# Patient Record
Sex: Male | Born: 1975 | Race: White | Hispanic: Yes | Marital: Married | Smoking: Never smoker
Health system: Southern US, Community
[De-identification: ages and names within clinical notes are randomized; demographics above are authoritative.]

## PROBLEM LIST (undated history)

## (undated) DIAGNOSIS — I10 Essential (primary) hypertension: Secondary | ICD-10-CM

---

## 2000-10-18 ENCOUNTER — Emergency Department (HOSPITAL_COMMUNITY): Admission: EM | Admit: 2000-10-18 | Discharge: 2000-10-18 | Payer: Self-pay | Admitting: Internal Medicine

## 2001-05-21 ENCOUNTER — Ambulatory Visit (HOSPITAL_COMMUNITY): Admission: RE | Admit: 2001-05-21 | Discharge: 2001-05-21 | Payer: Self-pay | Admitting: Pulmonary Disease

## 2001-11-06 ENCOUNTER — Ambulatory Visit (HOSPITAL_COMMUNITY): Admission: RE | Admit: 2001-11-06 | Discharge: 2001-11-06 | Payer: Self-pay | Admitting: Pulmonary Disease

## 2005-05-17 ENCOUNTER — Ambulatory Visit (HOSPITAL_COMMUNITY): Admission: RE | Admit: 2005-05-17 | Discharge: 2005-05-17 | Payer: Self-pay | Admitting: Pulmonary Disease

## 2010-02-21 ENCOUNTER — Encounter: Payer: Self-pay | Admitting: Pulmonary Disease

## 2012-05-07 ENCOUNTER — Emergency Department (HOSPITAL_COMMUNITY)
Admission: EM | Admit: 2012-05-07 | Discharge: 2012-05-07 | Disposition: A | Discharge status: Home / Self Care | Payer: Self-pay | Attending: Emergency Medicine | Admitting: Emergency Medicine

## 2012-05-07 ENCOUNTER — Encounter (HOSPITAL_COMMUNITY): Payer: Self-pay

## 2012-05-07 DIAGNOSIS — I10 Essential (primary) hypertension: Secondary | ICD-10-CM | POA: Insufficient documentation

## 2012-05-07 NOTE — ED Provider Notes (Signed)
History  This chart was scribed for Kyle Gaskins, MD by Bennett Scrape, ED Scribe. This patient was seen in room APA11/APA11 and the patient's care was started at 10:17 PM.  CSN: [id number]  Arrival date & time 05/07/12  2138   First MD Initiated Contact with Patient 05/07/12 2217      Chief Complaint  Patient presents with  . Hypertension    Patient is a 37 y.o. male presenting with hypertension. The history is provided by the patient. No language interpreter was used.  Hypertension This is a new problem. The problem occurs constantly. The problem has been gradually improving. Pertinent negatives include no chest pain, no headaches and no shortness of breath. Nothing aggravates the symptoms. Nothing relieves the symptoms. He has tried nothing for the symptoms.    Kyle Garner is a 37 y.o. male who presents to the Emergency Department complaining of HTN noted during a pre-employment physical today. BP is 143/89 upon last recheck. He is here for a recheck and possible medications. He denies any prior diagnosis and denies being on daily HTN medication. He denies HA, visual changes, CP, SOB, dizziness, and weakness or numbness in extremities as associated symptoms. Pt does not have a h/o chronic medical conditions and denies smoking and alcohol use.   PMH - none  History reviewed. No pertinent past surgical history.  No family history on file.  History  Substance Use Topics  . Smoking status: Never Smoker   . Smokeless tobacco: Not on file  . Alcohol Use: No      Review of Systems  Eyes: Negative for visual disturbance.  Respiratory: Negative for shortness of breath.   Cardiovascular: Negative for chest pain.  Neurological: Negative for dizziness, weakness, numbness and headaches.    Allergies  Review of patient's allergies indicates no known allergies.  Home Medications   Current Outpatient Rx  Name  Route  Sig  Dispense  Refill  . diphenhydrAMINE (ALLERGY  RELIEF) 25 MG tablet   Oral   Take 25 mg by mouth daily as needed for allergies.           Triage Vitals: BP 166/104  Pulse 77  Temp(Src) 97.5 F (36.4 C) (Oral)  Resp 18  Ht 5\' 5"  (1.651 m)  Wt 200 lb (90.719 kg)  BMI 33.28 kg/m2  SpO2 100%  Physical Exam  Nursing note and vitals reviewed.  CONSTITUTIONAL: Well developed/well nourished HEAD: Normocephalic/atraumatic EYES: EOMI ENMT: Mucous membranes moist NECK: supple no meningeal signs CV: S1/S2 noted, no murmurs/rubs/gallops noted LUNGS: Lungs are clear to auscultation bilaterally, no apparent distress ABDOMEN: soft, nontender, no rebound or guarding GU:no cva tenderness NEURO: Pt is awake/alert, moves all extremitiesx4 EXTREMITIES: pulses normal, full ROM SKIN: warm, color normal PSYCH: no abnormalities of mood noted  ED Course  Procedures   DIAGNOSTIC STUDIES: Oxygen Saturation is 100% on room air, normal by my interpretation.    COORDINATION OF CARE: 10:37 PM-Informed pt of BP decreasing to near normal levels in the ED. Discussed discharge plan which includes work note with pt and pt agreed to plan. Also advised pt to follow up with a PCP and pt agreed.    MDM  Nursing notes including past medical history and social history reviewed and considered in documentation       I personally performed the services described in this documentation, which was scribed in my presence. The recorded information has been reviewed and is accurate.      Kyle Gaskins,  MD 05/07/12 2253

## 2012-05-07 NOTE — ED Notes (Signed)
Pt had physical at work and found out about high blood pressure, denies any pain or any other symptoms

## 2012-05-07 NOTE — ED Notes (Signed)
Went for a pre-employment physical today and was found to be hypertensive. Here for recheck and possible medications

## 2013-04-09 ENCOUNTER — Emergency Department (HOSPITAL_COMMUNITY): Payer: 59

## 2013-04-09 ENCOUNTER — Encounter (HOSPITAL_COMMUNITY): Payer: Self-pay | Admitting: Emergency Medicine

## 2013-04-09 ENCOUNTER — Emergency Department (HOSPITAL_COMMUNITY)
Admission: EM | Admit: 2013-04-09 | Discharge: 2013-04-09 | Disposition: A | Discharge status: Home / Self Care | Payer: 59 | Attending: Emergency Medicine | Admitting: Emergency Medicine

## 2013-04-09 DIAGNOSIS — I10 Essential (primary) hypertension: Secondary | ICD-10-CM | POA: Insufficient documentation

## 2013-04-09 DIAGNOSIS — W208XXA Other cause of strike by thrown, projected or falling object, initial encounter: Secondary | ICD-10-CM | POA: Insufficient documentation

## 2013-04-09 DIAGNOSIS — Z79899 Other long term (current) drug therapy: Secondary | ICD-10-CM | POA: Insufficient documentation

## 2013-04-09 DIAGNOSIS — S02401A Maxillary fracture, unspecified, initial encounter for closed fracture: Principal | ICD-10-CM | POA: Insufficient documentation

## 2013-04-09 DIAGNOSIS — Y929 Unspecified place or not applicable: Secondary | ICD-10-CM | POA: Insufficient documentation

## 2013-04-09 DIAGNOSIS — Y9389 Activity, other specified: Secondary | ICD-10-CM | POA: Insufficient documentation

## 2013-04-09 DIAGNOSIS — S02402A Zygomatic fracture, unspecified, initial encounter for closed fracture: Secondary | ICD-10-CM

## 2013-04-09 DIAGNOSIS — S02400A Malar fracture unspecified, initial encounter for closed fracture: Secondary | ICD-10-CM | POA: Insufficient documentation

## 2013-04-09 HISTORY — DX: Essential (primary) hypertension: I10

## 2013-04-09 MED ORDER — AMLODIPINE BESYLATE 10 MG PO TABS: 10.0000 mg | ORAL_TABLET | Freq: Every day | ORAL | Status: AC

## 2013-04-09 MED ORDER — OXYCODONE-ACETAMINOPHEN 5-325 MG PO TABS: 1.0000 | ORAL_TABLET | ORAL | Status: AC | PRN

## 2013-04-09 NOTE — ED Notes (Signed)
Pt was working on his car this am when the jack fell causing the jack handle to hit pt in the right side of the face, denies any loc, states that he continues to have pain to right side of face area.

## 2013-04-09 NOTE — ED Notes (Signed)
Pt states that he has hx of high blood pressure , has been on medication for the past three months but whenever he checks his blood pressure it is still reading high like today.

## 2013-04-09 NOTE — Discharge Instructions (Signed)
Fractura facial (Facial Fracture) Una fractura facial es la ruptura de uno de los huesos de la cara. INSTRUCCIONES PARA EL CUIDADO DOMICILIARIO  Proteja la parte lesionada del rostro hasta que se cure.  No participe en actividades que posibiliten una nueva lesin hasta que el mdico lo apruebe.  Lave y seque su rostro suavemente.  Utilice proteccin en la cabeza y la cara cuando conduzca una bicicleta, un ciclomotor o una motonieve. SOLICITE ATENCIN MDICA SI:  La temperatura oral se eleva sin motivo por encima de 38,9 C (102 F) o segn le indique el profesional que lo asiste.  Sufre un dolor de cabeza intenso o nota cambios en la visin.  Siente el rostro adormecido o cosquilleos.  Si tiene nuseas (ganas de vomitar), vmitos o rigidez en el cuello. SOLICITE ATENCIN MDICA DE INMEDIATO SI:  Presenta dificultad para ver o visin doble.  Se siente mareado, aturdido o se desmaya.  Tiene problemas para hablar, respirar o tragar.  Presenta una secrecin acuosa que proviene de la nariz o del odo. EST SEGURO QUE:  Comprende las instrucciones para el alta mdica.  Controlar su enfermedad.  Solicitar atencin mdica de inmediato segn las indicaciones. Document Released: 10/27/2004 Document Revised: 04/11/2011 [facility] Patient Information 2014 [place], [place].  Hipertensin arterial (Arterial Hypertension) La hipertensin arterial (presin sangunea elevada) es un trastorno que consiste en la elevacin de la presin dentro de sus vasos sanguneos. La hipertensin, con el correr del tiempo, favorece el riesgo de padecer ictus, infartos o insuficiencias cardacas. Tambin es una de las causas principales de insuficiencia renal (del rin).  CAUSAS  En adultos  Ms del 90% de todos los casos de hipertensin no tienen causas conocidas. Esto se denomina hipertensin esencial o primaria. En el 10% restante de personas con hipertensin, el aumento en la presin arterial es  causado por otras enfermedades. Esto se denomina hipertensin secundaria. LAS CAUSAS MS IMPORTANTES DE HIPERTENSIN SECUDARIA SON:  Abuso en el consumo de bebidas alcohlicas.  Apnea del sueo obstructiva.  Hiperaldosteronismo (Sndrome de [place]).  Uso de esteroides.  Insuficiencia renal crnica.  Hiperparatiroidismo.  Medicamentos.  Estenosis de la arteria renal.  Feocromocitoma.  Enfermedad de [place].  Coartacin de la aorta.  Crisis de esclerodermia renal.  Regaliz (en cantidades excesivas).  Drogas (cocana, metanfetamina). El profesional que lo asiste le Hess Corporationexplicar los puntos mencionados arriba que pueden estar referidos a usted.  En nios  La hipertensin secundaria es la ms comn y siempre debe ser considerada como causa.  Embarazo  Pocas mujeres en edad de procrear tienen alta presin arterial. Sin embargo, hasta el 10% de ellas desarrollan hipertensin inducida por el embarazo. En general, esto no perjudicar a la mujer (benigno). Puede ser un sntoma de 3 complicaciones del embarazo: preeclampsia, sndrome HELLP (por sus siglas en ingls) y eclampsia. Es necesario Medical sales representativerealizar un seguimiento y Air traffic controllerel control con medicacin. SNTOMAS  Esta enfermedad normalmente no produce ningn sntoma perceptible. Generalmente se descubre en un examen de rutina.  La hipertensin maligna (hipertensin acelerada) es un problema (complicacin) tarda de la presin arterial elevada. Puede tener los siguientes sntomas:  Dolor de [place]abe[place].  Visin borrosa.  Dao en rganos diana (significa que los riones, el corazn, los pulmones y otros rganos se estn daando).  Las situaciones de estrs pueden aumentar la presin arterial. Si una persona con presin arterial normal tiene un aumento en su presin en el momento en que es controlada por el profesional, se denomina "hipertensin de guardapolvo blanco". Su gravedad no se conoce. Puede  estar relacionado con un desarrollo futuro de  hipertensin, o con complicaciones de la hipertensin.  La hipertensin generalmente se confunde con tensin intelectual, estrs y ansiedad. DIAGNSTICO El diagnstico se realiza mediante 3 mediciones de presin sangunea individuales. Se toman con una diferencia de al menos una semana entre Melia. Si hay un rgano daado por causa de la hipertensin, el diagnstico puede realizarse sin repetir [employer]. La hipertensin normalmente se identifica (diagnostica) si se obtienen los siguientes niveles:  Ms de 140/90 mm Hg medidos en ambos brazos, en 3 mediciones distintas, a lo largo de [employer].  Ms de 130/80 mm Hg debe considerarse un factor de riesgo y puede requerir tratamiento en pacientes con diabetes. La presin arterial de ms de 120/80 mm Hg se denomina "pre-hipertensin", an en pacientes no diabticos. Para obtener una medicin precisa de la presin sangunea, siga las siguientes indicaciones. Sea consciente de los factores que pueden alterar las mediciones de presin sangunea.  Tome la presin al menos una hora luego de consumir cafena.  Tome la presin 30 minutos luego de fumar y sin Librarian, academic. Este es otro motivo para dejar de fumar: aumenta la presin arterial.  Use un manguito de tamao adecuado. Consulte al profesional que lo asiste si no est seguro acerca del tamao que le corresponde.  La mayor parte de los tensimetros hogareos son automticos. Medirn la presin sistlica y diastlica. La presin sistlica es la que se mide al comienzo de los sonidos. La presin diastlica es la presin en la que los sonidos desaparecen. Si usted es [place], mida distintas presiones en distintas posiciones. Intente sentado, acostado o parado.  Sintese y descanse por al menos 5 minutos antes de [employer].  No debera tomar medicamentos como descongestionantes (estimulantes adrenrgicos) antes de Museum/gallery conservator. Estos se encuentran en muchos medicamentos para el  resfro.  Tome nota de las mediciones de su presin sangunea e infrmeselas al profesional que lo asiste. Si usted tiene hipertensin:  El profesional que lo asiste podr Medical illustrator para asegurarse de que no tenga hipertensin secundaria (ver "causas", arriba).  El profesional que lo asiste tambin podr buscar sntomas de Sndrome Metablico. Tambin se denomina Sndrome X o Sndrome de Insulinorresistencia. Podr tener este sndrome si, adems de hipertensin, tiene diabetes del tipo 2, obesidad abdominal, y lpidos sanguneos anormales.  El profesional que lo asiste tomar su historia mdica y familiar y Education officer, environmental un examen fsico.  Las pruebas de diagnstico pueden incluir exmenes de sangre (de glucosa, [place], [place], y funcin del rin), un anlisis de [place], o Artist. Puede ser necesario realizar otras pruebas segn su caso particular. PREVENCIN Existen algunos importantes consejos relacionados con su estilo de vida que puede adoptar para reducir sus probabilidades de desarrollar hipertensin.  Mantenga un peso normal.  Limite la cantidad de sal (sodio) en su dieta.  Ejerctese regularmente.  Limite el consumo de alcohol.  Lleve una dieta con suficiente potasio. Converse consejos especficos con el profesional que lo asiste.  Haga un dieta DASH ( dieta que ayuda a frenar la hipertensin por sus siglas en ingls). Esta dieta es rica en frutas, vegetales, y productos lcteos descremados, y evita ciertos tipos de grasas. PRONSTICO: La hipertensin esencial no puede curarse. Los [employer] en el estilo de vida y el tratamiento mdico pueden disminuir la presin sangunea y reducir las complicaciones. El pronstico de la hipertensin secundaria depende de la causa subyacente. Muchas personas controlan su hipertensin con medicamentos o cambios en su estilo de vida  y de esta forma pueden vivir Neomia Dear vida normal y saludable.  RIESGOS Y COMPLICACIONES. Mientras  la presin arterial alta en s misma no es una enfermedad, a menudo requiere [employer] [facility] [employer] a los efectos a Equities trader y Education officer, community plazo que tiene sobre muchos rganos. La hipertensin aumenta el riesgo de padecer:  ACV o ictus (accidentes cerebrovasculares)  Insuficiencia cardaca debido a la presin arterial elevada crnica (Cardiomiopata hipertensiva).  Ataques cardacos (infarto del miocardio).  Lesin en la retina (retinopata hipertensiva).  Insuficiencia renal (nefropata hipertensiva). El profesional que lo asiste le [employer] puntos de esta lista que pueden estar referidos a usted. El tratamiento de la hipertensin puede reducir significativamente el riesgo de complicaciones. TRATAMIENTO  Se recomienda la prdida de peso para los pacientes excedidos y la prctica regular de ejercicio. El buen estado fsico reduce la presin arterial.  La hipertensin leve generalmente se trata con dieta y ejercicio. Una dieta rica en frutas y vegetales, lcteos descremados y alimentos bajos en grasas y sal (sodio) pueden ayudar a disminuir la presin arterial. La disminucin del consumo de sal disminuye la presin arterial en un tercio de las personas.  Si es fumador, abandone el hbito. Estos pasos son muy efectivos para reducir la presin arterial. Estas acciones a seguir son fciles de sugerir pero difciles de llevar a cabo. La mayora de los pacientes con hipertensin moderada o grave finalmente necesita medicamentos para retornar la presin a Pension scheme manager. Hay varios tipos de medicamentos que se emplean en el tratamiento. Las pastillas para la presin arterial (antihipertensivos) la disminuyen a travs de sus diferentes acciones. La disminucin de la presin arterial en 10 mm Hg puede disminuir el riesgo de complicaciones en un 25 %. El objetivo del tratamiento es el control efectivo de la presin arterial. Esto reducir el riesgo de complicaciones. El profesional que lo asiste le ayudar a  Leisure centre manager tratamiento para usted, de acuerdo con su estilo de vida. Lo que puede ser un excelente tratamiento para Neomia Dear persona, puede no serlo para [place]. INSTRUCCIONES PARA EL CUIDADO DOMICILIARIO  NO fume.  Adapte su estilo de vida de acuerdo a las indicaciones de la seccin "Prevencin."  Si est tomando medicamentos, siga las indicaciones cuidadosamente. Los medicamentos para la presin arterial deben tomarse segn fueron prescritos. Saltear dosis reduce sus beneficios. Tambin lo pone en riesgo de padecer complicaciones.  Concurra a las consultas de seguimiento con el profesional que lo asiste, segn le haya indicado.  Si se le solicita que controle su presin arterial en su hogar, siga las anteriores indicaciones de la seccin "diagnstico." SOLICITE ATENCIN MDICA SI:  Cree que est sufriendo efectos secundarios de la medicacin.  Siente dolores de [place] recurrentes o se siente mareado.  Siente hinchazn en sus tobillos.  Tiene problemas visuales. SOLICITE ATENCIN MDICA DE INMEDIATO SI:  Comienza sbitamente a sentir dolor o presin en el pecho, dificultad para respirar, u otros sntomas de ataque cardaco.  Sufre una cefalea grave.  Tiene sntomas de ataque cardaco (debilidad repentina, dificultad para hablar, dificultad para caminar). EST SEGURO QUE:   Comprende las instrucciones para el alta mdica.  Controlar su enfermedad.  Solicitar atencin mdica de inmediato segn las indicaciones. Document Released: 01/17/2005 Document Revised: 04/11/2011 [facility] Patient Information 2014 [place], [place].

## 2013-04-09 NOTE — ED Notes (Signed)
Pt seen and evaluated by EDPa for initial assessment. 

## 2013-04-11 NOTE — ED Provider Notes (Signed)
CSN: [id number]     Arrival date & time 04/09/13  1840 History   First MD Initiated Contact with Patient 04/09/13 1856     Chief Complaint  Patient presents with  . Facial Injury     (Consider location/radiation/quality/duration/timing/severity/associated sxs/prior Treatment) HPI Comments: Kyle Garner is a 38 y.o. Male presenting with right facial pain and swelling.  He was working on his car this morning when the jack slipped,  Causing the jack handle to strike his right face.  He denies loss of consciousness and any other location of pain.  His vision has been normal, he denies nasal congestion, nose bleed or drainage. He has increased pain with attempts to fully open his mouth.   He has taken tylenol with no improvement in pain.  Additionally, he has been taking amlodipine 5 mg for almost the past year which was prescribed by an outside emergency department.  He reports checking his blood pressures occasionally and has persistently elevated bps. He denies history of chronic headache, vision changes, chest pain or shortness of breath.  He does not have a current pcp.     The history is provided by the patient.    Past Medical History  Diagnosis Date  . Hypertension    History reviewed. No pertinent past surgical history. No family history on file. History  Substance Use Topics  . Smoking status: Never Smoker   . Smokeless tobacco: Not on file  . Alcohol Use: No    Review of Systems  Constitutional: Negative for fever.  HENT: Positive for facial swelling. Negative for hearing loss and sore throat.   Eyes: Negative.   Respiratory: Negative for chest tightness and shortness of breath.   Cardiovascular: Negative for chest pain.  Gastrointestinal: Negative for abdominal pain.  Genitourinary: Negative.   Musculoskeletal: Negative for arthralgias and joint swelling.  Skin: Negative.  Negative for rash and wound.  Neurological: Negative for dizziness, weakness,  light-headedness and numbness.  Psychiatric/Behavioral: Negative.       Allergies  Review of patient's allergies indicates no known allergies.  Home Medications   Current Outpatient Rx  Name  Route  Sig  Dispense  Refill  . amLODipine (NORVASC) 5 MG tablet   Oral   Take 5 mg by mouth at bedtime.         Marland Kitchen amLODipine (NORVASC) 10 MG tablet   Oral   Take 1 tablet (10 mg total) by mouth daily.   30 tablet   0   . oxyCODONE-acetaminophen (PERCOCET/ROXICET) 5-325 MG per tablet   Oral   Take 1 tablet by mouth every 4 (four) hours as needed for severe pain.   20 tablet   0   . oxyCODONE-acetaminophen (PERCOCET/ROXICET) 5-325 MG per tablet   Oral   Take 1 tablet by mouth every 4 (four) hours as needed for severe pain.   6 tablet   0    BP 174/104  Pulse 94  Temp(Src) 98.9 F (37.2 C) (Oral)  Resp 18  Ht 5\' 5"  (1.651 m)  Wt 200 lb (90.719 kg)  BMI 33.28 kg/m2  SpO2 100% Physical Exam  Nursing note and vitals reviewed. Constitutional: He appears well-developed and well-nourished.  HENT:  Head: Normocephalic. Head is without raccoon's eyes, without Battle's sign, without right periorbital erythema and without left periorbital erythema.    Right Ear: No hemotympanum.  Left Ear: No hemotympanum.  Nose: Nose normal.  Mouth/Throat: Oropharynx is clear and moist.  ttp along right zygoma and temple  area.  Edema without ecchymosis, skin intact.  Eyes: Conjunctivae and EOM are normal. Pupils are equal, round, and reactive to light.  Neck: Normal range of motion.  nontender midline  Cardiovascular: Normal rate, regular rhythm, normal heart sounds and intact distal pulses.   Pulmonary/Chest: Effort normal and breath sounds normal. He has no wheezes. He has no rales.  Abdominal: Soft. Bowel sounds are normal. There is no tenderness.  Musculoskeletal: Normal range of motion. He exhibits no edema and no tenderness.  Neurological: He is alert.  Skin: Skin is warm and dry.    Psychiatric: He has a normal mood and affect.    ED Course  Procedures (including critical care time) Labs Review Labs Reviewed - No data to display Imaging Review Dg Mandible 4 Views  04/09/2013   CLINICAL DATA Right-sided facial pain.  Right lateral jaw pain.  EXAM MANDIBLE - 4+ VIEW  COMPARISON None.  FINDINGS There is no evidence of fracture or other focal bone lesions. The temporomandibular joint is suboptimally visualized.  IMPRESSION No acute osseous injury of the mandible. The right temporomandibular joint is suboptimally visualized. If there is clinical concern regarding injury to knee right temporomandibular region recommend a CT of the maxillofacial bones.  SIGNATURE  Electronically Signed   By: Elige Ko   On: 04/09/2013 19:47   Ct Maxillofacial Wo Cm  04/09/2013   CLINICAL DATA Facial trauma, right facial pain  EXAM CT MAXILLOFACIAL WITHOUT CONTRAST  TECHNIQUE Multidetector CT imaging of the maxillofacial structures was performed. Multiplanar CT image reconstructions were also generated. A small metallic BB was placed on the right temple in order to reliably differentiate right from left.  COMPARISON None.  FINDINGS There is right facial soft tissue swelling particularly over the right zygomatic arch. The globes are intact. The orbital walls are intact. The orbital floors are intact. The maxilla is intact. The mandible is intact. There is old deformity of the right lamina papyracea. The left zygomatic arch is intact. There is a comminuted and depressed right zygomatic arch fracture. The nasal septum is midline. There is no nasal bone fracture. The temporomandibular joints are normal. There is right maxillary sinus mucosal thickening. There are no paranasal sinus air-fluid levels. The visualized portions of the mastoid sinuses are well aerated.  IMPRESSION 1. Comminuted and depressed right zygomatic arch fracture with overlying soft tissue swelling.  SIGNATURE  Electronically Signed    By: Elige Ko   On: 04/09/2013 20:50     EKG Interpretation None      MDM   Final diagnoses:  Zygomatic arch fracture  Hypertension    Patients labs and/or radiological studies were viewed and considered during the medical decision making and disposition process. Pt's findings discussed with him,  Ct scan shown.  Also discussed case with Dr. Juleen China.  Pt with zygomatic arch fracture which will cause a cosmetic concern for this patient, probably not a functional one.  He was referred to Dr. Suszanne Conners for ENT followup. Pt agrees to f/u with him.  Pt was prescribed oxycodone, encouraged ice packs for swelling relief.  Also with persistent htn despite amlodipine 5 mg, patient advised to double this medicine to 10 mg.  He was prescribed a 30 day supply for the 10 mg tablets.  Also given referral for establishment of primary care for further management of his htn, referral list given,  Including Dr. Margo Aye who may be accepting new patients at this time.  Pt understands and agrees with plan.  Burgess AmorJulie Hammad Finkler, PA-C 04/11/13 1410

## 2013-04-14 NOTE — ED Provider Notes (Signed)
Medical screening examination/treatment/procedure(s) were performed by non-physician practitioner and as supervising physician I was immediately available for consultation/collaboration.   EKG Interpretation None       Aylani Spurlock, MD 04/14/13 1937 

## 2013-04-16 MED FILL — Oxycodone w/ Acetaminophen Tab 5-325 MG: ORAL | Qty: 6 | Status: AC

## 2015-10-24 IMAGING — CR DG MANDIBLE 4+V
4 series · 4 of 4 positions shown · non-contrast
Comparison: none

[view not recorded (1 of 4)]
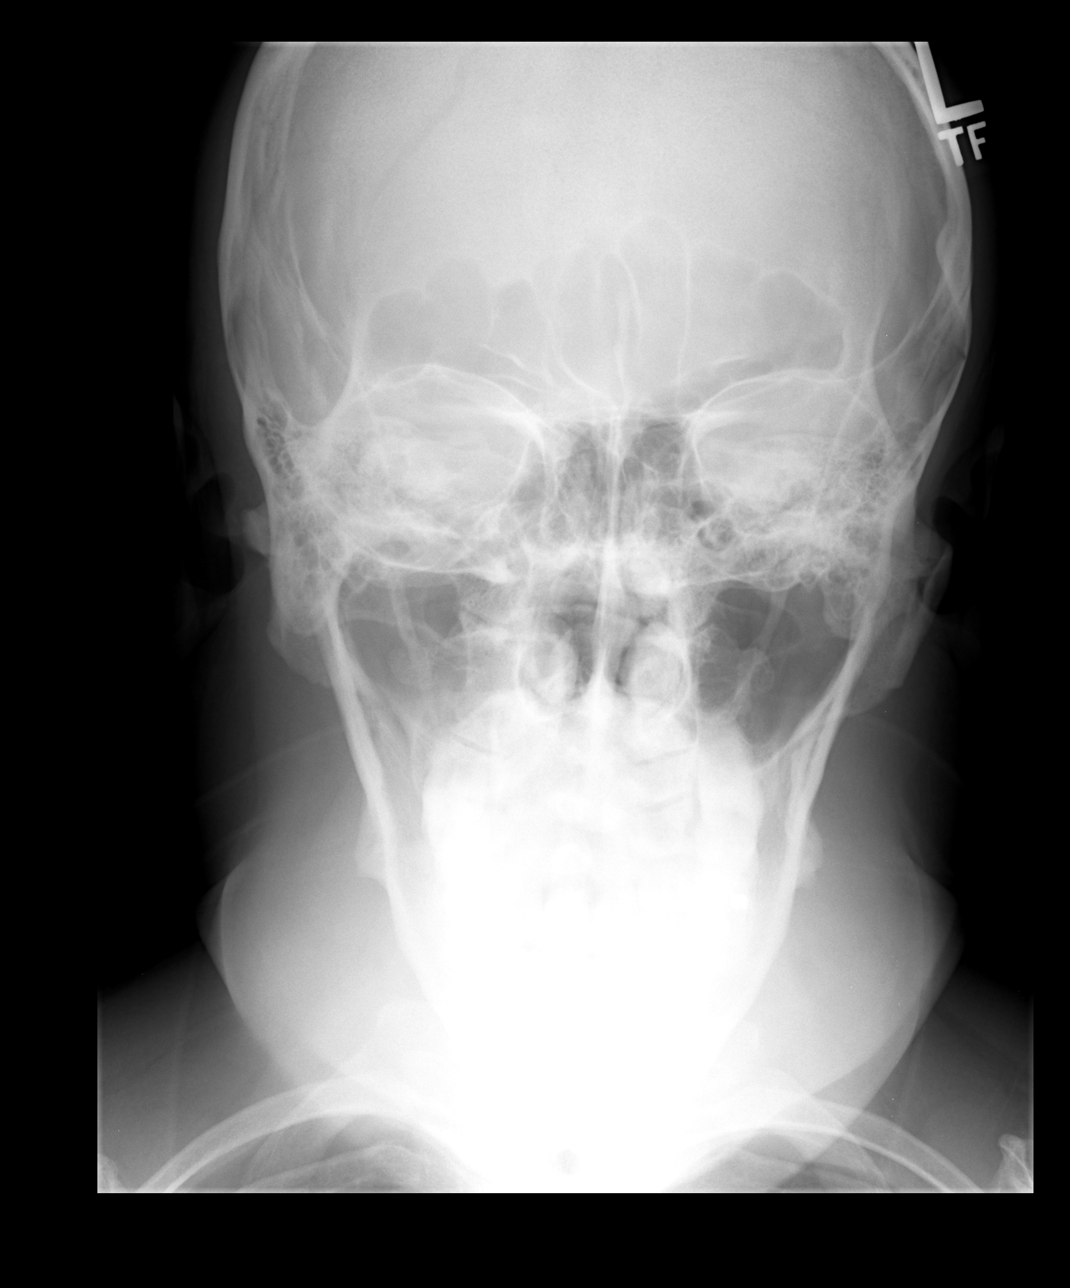

[view not recorded (2 of 4)]
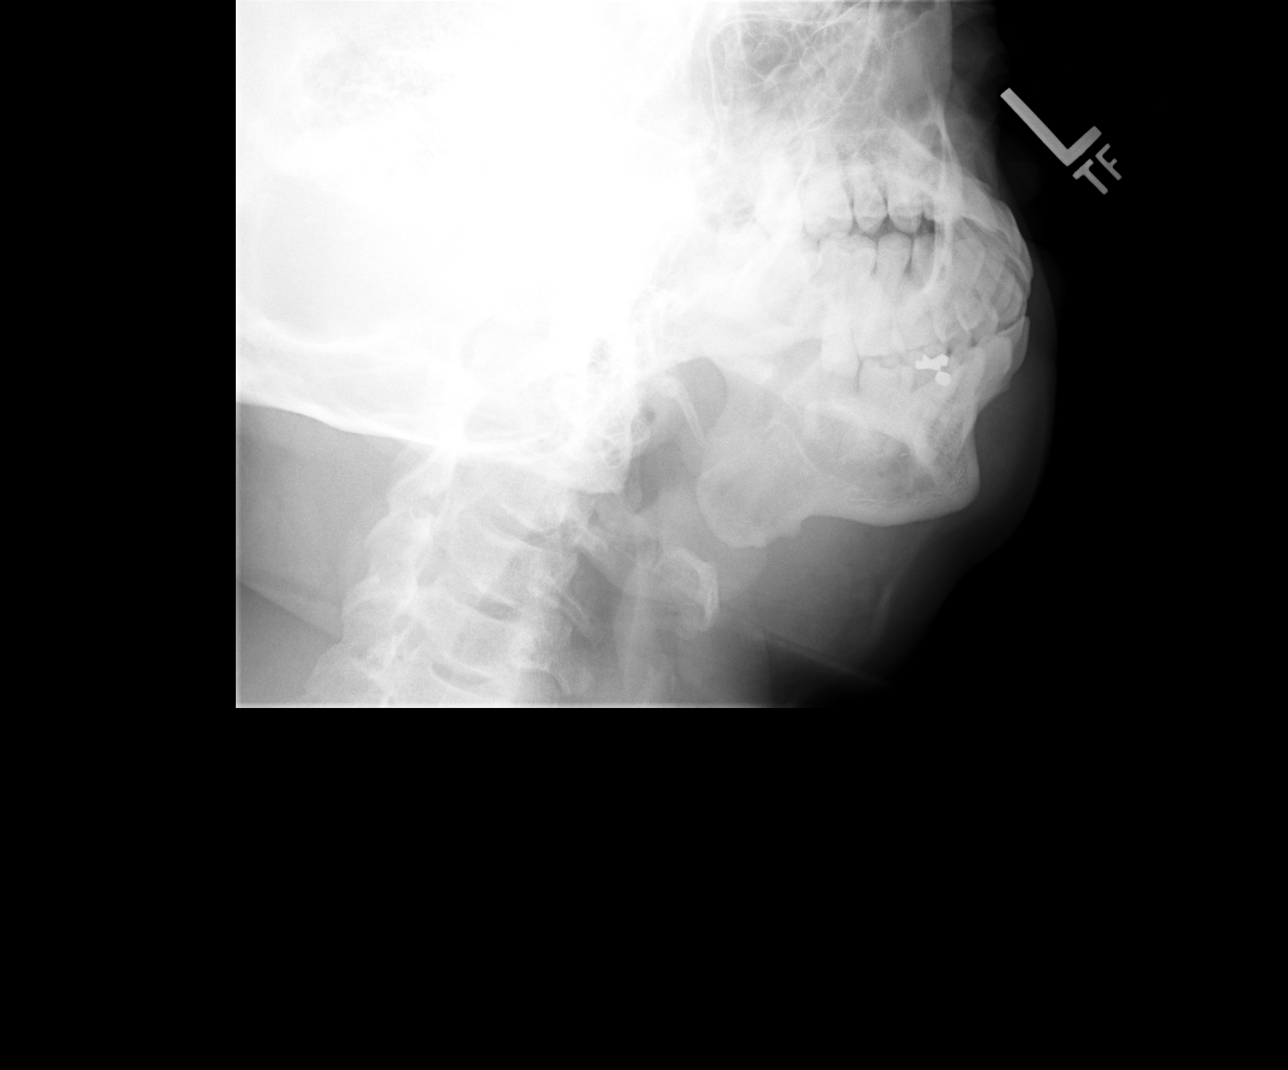

[view not recorded (3 of 4)]
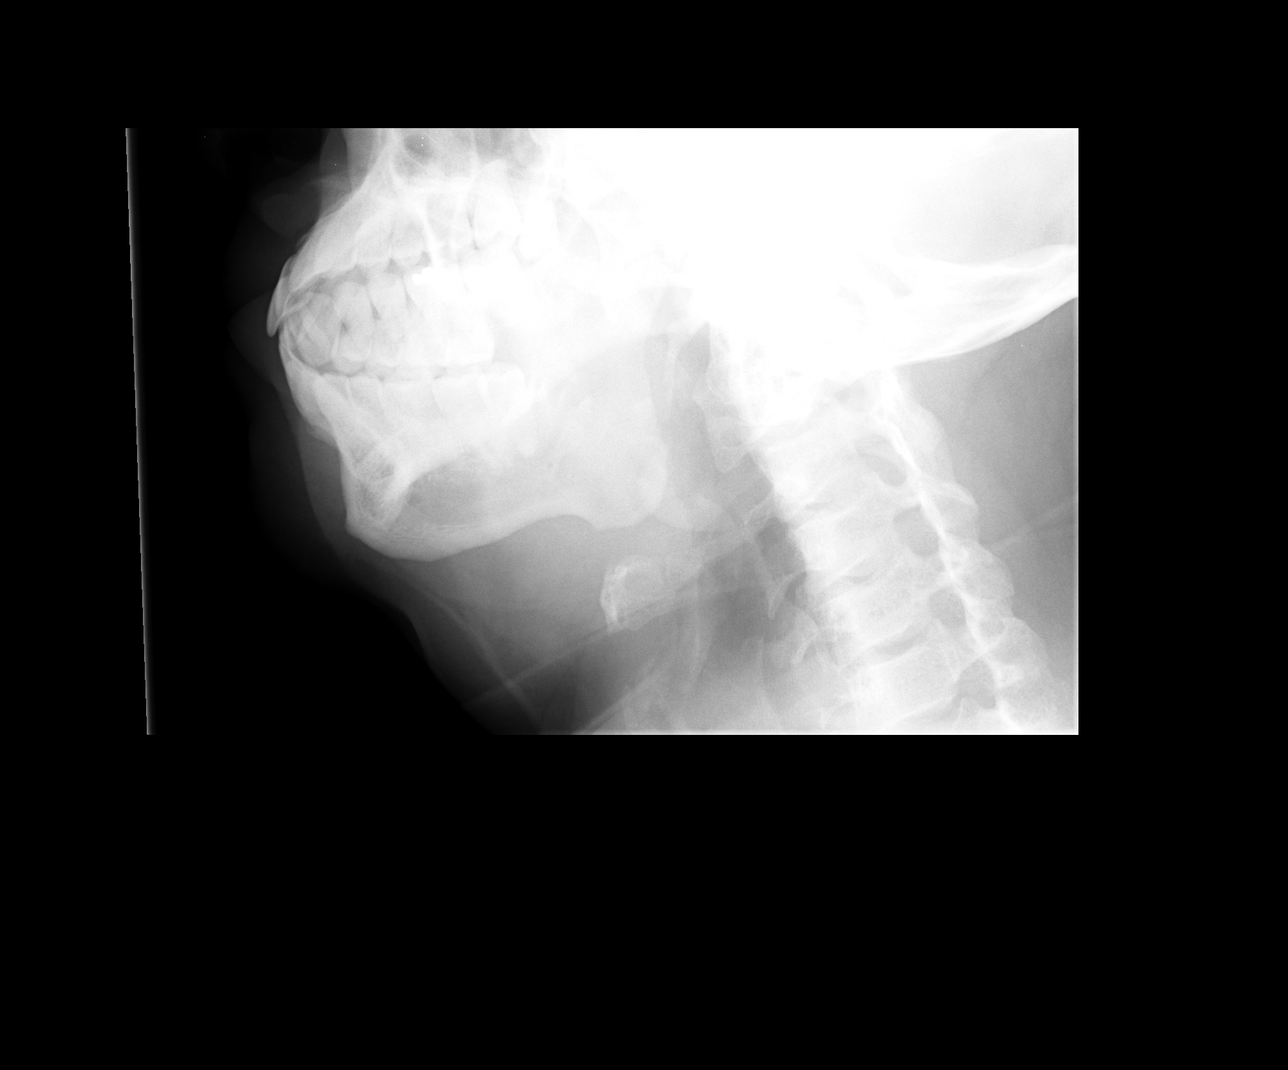

[view not recorded (4 of 4)]
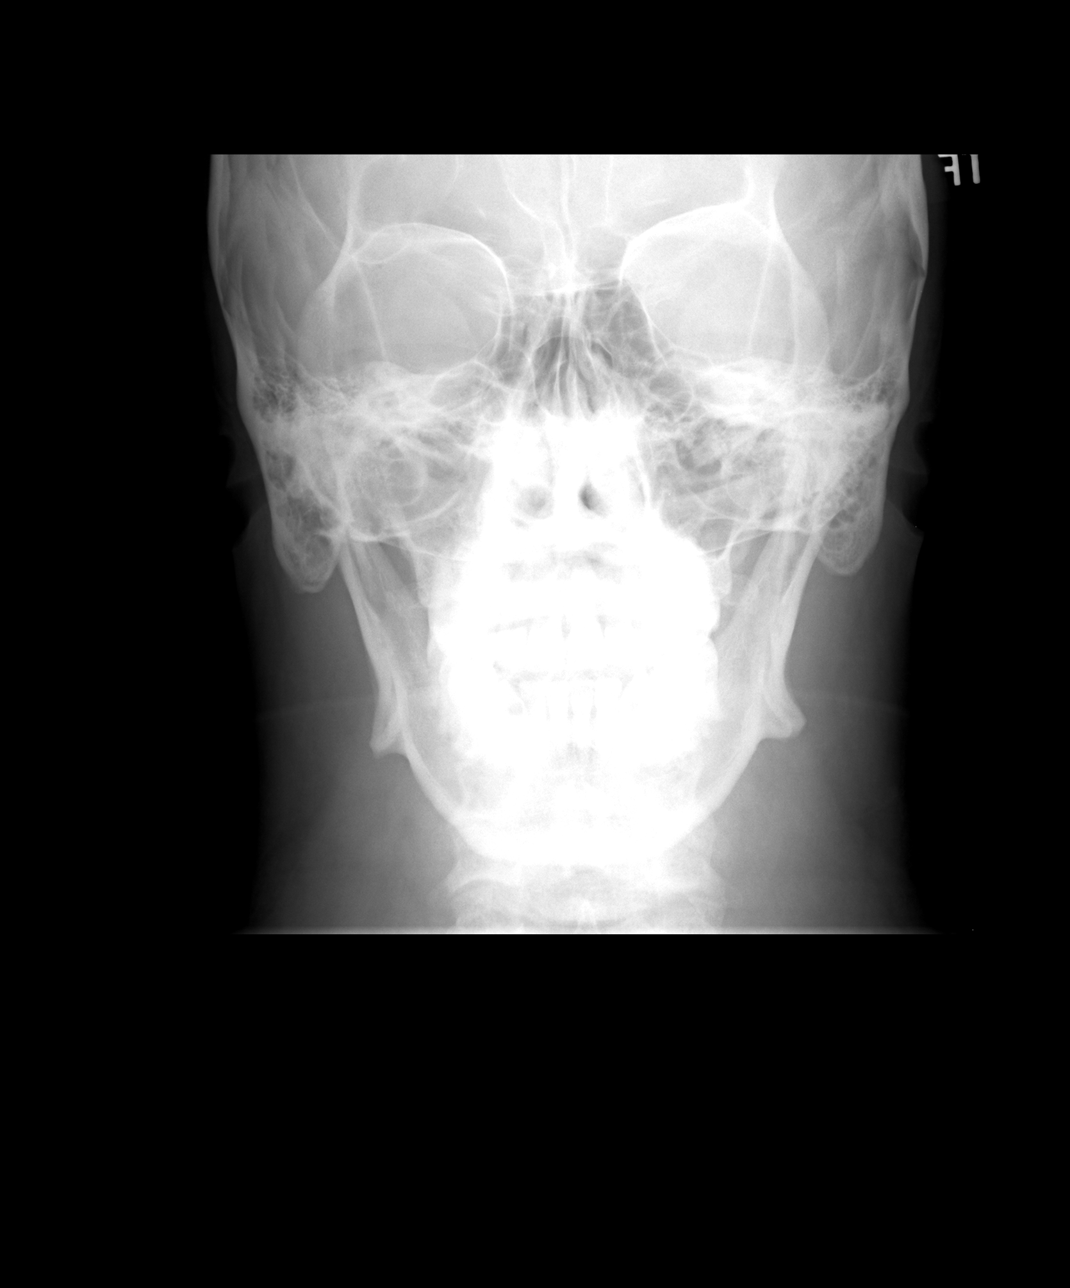

[4 of 4 positions shown; findings below may reference images not displayed]

CLINICAL DATA
Right-sided facial pain.  Right lateral jaw pain.

EXAM
MANDIBLE - 4+ VIEW

COMPARISON
None.

FINDINGS
There is no evidence of fracture or other focal bone lesions. The
temporomandibular joint is suboptimally visualized.

IMPRESSION
No acute osseous injury of the mandible. The right temporomandibular
joint is suboptimally visualized. If there is clinical concern
regarding injury to knee right temporomandibular region recommend a
CT of the maxillofacial bones.

SIGNATURE
